# Patient Record
Sex: Male | Born: 1955 | Hispanic: Yes | Marital: Married | Smoking: Never smoker
Health system: Southern US, Community
[De-identification: ages and names within clinical notes are randomized; demographics above are authoritative.]

## PROBLEM LIST (undated history)

## (undated) DIAGNOSIS — K469 Unspecified abdominal hernia without obstruction or gangrene: Secondary | ICD-10-CM

## (undated) HISTORY — PX: HERNIA REPAIR: SHX51

---

## 2007-04-09 ENCOUNTER — Ambulatory Visit: Payer: Self-pay | Admitting: Family Medicine

## 2008-02-11 ENCOUNTER — Ambulatory Visit: Payer: Self-pay | Admitting: Family Medicine

## 2013-11-28 ENCOUNTER — Ambulatory Visit: Payer: Self-pay | Admitting: Medical

## 2016-06-18 ENCOUNTER — Encounter: Payer: Self-pay | Admitting: Emergency Medicine

## 2016-06-18 ENCOUNTER — Emergency Department: Payer: BLUE CROSS/BLUE SHIELD

## 2016-06-18 ENCOUNTER — Inpatient Hospital Stay
Admission: EM | Admit: 2016-06-18 | Discharge: 2016-06-20 | DRG: 872 | Disposition: A | Discharge status: Home / Self Care | Payer: BLUE CROSS/BLUE SHIELD | Attending: Internal Medicine | Admitting: Internal Medicine

## 2016-06-18 DIAGNOSIS — N3001 Acute cystitis with hematuria: Secondary | ICD-10-CM

## 2016-06-18 DIAGNOSIS — Z809 Family history of malignant neoplasm, unspecified: Secondary | ICD-10-CM | POA: Diagnosis not present

## 2016-06-18 DIAGNOSIS — R300 Dysuria: Secondary | ICD-10-CM | POA: Diagnosis not present

## 2016-06-18 DIAGNOSIS — A419 Sepsis, unspecified organism: Principal | ICD-10-CM | POA: Diagnosis present

## 2016-06-18 HISTORY — DX: Unspecified abdominal hernia without obstruction or gangrene: K46.9

## 2016-06-18 LAB — CBC WITH DIFFERENTIAL/PLATELET
BASOS ABS: 0.1 10*3/uL (ref 0–0.1)
Basophils Relative: 0 %
Eosinophils Absolute: 0 10*3/uL (ref 0–0.7)
Eosinophils Relative: 0 %
HEMATOCRIT: 47.9 % (ref 40.0–52.0)
HEMOGLOBIN: 16.2 g/dL (ref 13.0–18.0)
LYMPHS PCT: 3 %
Lymphs Abs: 0.6 10*3/uL — ABNORMAL LOW (ref 1.0–3.6)
MCH: 30.7 pg (ref 26.0–34.0)
MCHC: 33.7 g/dL (ref 32.0–36.0)
MCV: 91 fL (ref 80.0–100.0)
MONO ABS: 1.4 10*3/uL — AB (ref 0.2–1.0)
Monocytes Relative: 6 %
NEUTROS ABS: 20.6 10*3/uL — AB (ref 1.4–6.5)
NEUTROS PCT: 91 %
Platelets: 234 10*3/uL (ref 150–440)
RBC: 5.27 MIL/uL (ref 4.40–5.90)
RDW: 14.1 % (ref 11.5–14.5)
WBC: 22.6 10*3/uL — ABNORMAL HIGH (ref 3.8–10.6)

## 2016-06-18 LAB — URINALYSIS, ROUTINE W REFLEX MICROSCOPIC: SPECIFIC GRAVITY, URINE: 1.024 (ref 1.005–1.030)

## 2016-06-18 LAB — COMPREHENSIVE METABOLIC PANEL
ALBUMIN: 4.5 g/dL (ref 3.5–5.0)
ALT: 24 U/L (ref 17–63)
AST: 26 U/L (ref 15–41)
Alkaline Phosphatase: 63 U/L (ref 38–126)
Anion gap: 9 (ref 5–15)
BILIRUBIN TOTAL: 3 mg/dL — AB (ref 0.3–1.2)
BUN: 12 mg/dL (ref 6–20)
CO2: 23 mmol/L (ref 22–32)
Calcium: 9.2 mg/dL (ref 8.9–10.3)
Chloride: 101 mmol/L (ref 101–111)
Creatinine, Ser: 0.92 mg/dL (ref 0.61–1.24)
GFR calc Af Amer: >60 mL/min (ref >60)
GFR calc non Af Amer: >60 mL/min (ref >60)
GLUCOSE: 117 mg/dL — AB (ref 65–99)
POTASSIUM: 4.2 mmol/L (ref 3.5–5.1)
SODIUM: 133 mmol/L — AB (ref 135–145)
TOTAL PROTEIN: 8 g/dL (ref 6.5–8.1)

## 2016-06-18 LAB — URINALYSIS, COMPLETE (UACMP) WITH MICROSCOPIC
BILIRUBIN URINE: NEGATIVE
Bacteria, UA: NONE SEEN
GLUCOSE, UA: 150 mg/dL — AB
HGB URINE DIPSTICK: NEGATIVE
Ketones, ur: 5 mg/dL — AB
NITRITE: POSITIVE — AB
PROTEIN: 30 mg/dL — AB
Specific Gravity, Urine: 1.015 (ref 1.005–1.030)
pH: 7 (ref 5.0–8.0)

## 2016-06-18 LAB — LACTIC ACID, PLASMA: LACTIC ACID, VENOUS: 1.3 mmol/L (ref 0.5–1.9)

## 2016-06-18 LAB — GLUCOSE, CAPILLARY: GLUCOSE-CAPILLARY: 133 mg/dL — AB (ref 65–99)

## 2016-06-18 MED ORDER — ONDANSETRON HCL 4 MG/2ML IJ SOLN: 4.0000 mg | Freq: Four times a day (QID) | INTRAMUSCULAR | Status: DC | PRN

## 2016-06-18 MED ORDER — ONDANSETRON HCL 4 MG PO TABS: 4.0000 mg | ORAL_TABLET | Freq: Four times a day (QID) | ORAL | Status: DC | PRN

## 2016-06-18 MED ORDER — SODIUM CHLORIDE 0.9 % IV SOLN
INTRAVENOUS | Status: DC
  Administered 2016-06-18 – 2016-06-20 (×5): via INTRAVENOUS

## 2016-06-18 MED ORDER — DEXTROSE 5 % IV SOLN
1.0000 g | INTRAVENOUS | Status: DC
  Administered 2016-06-19: 1 g via INTRAVENOUS
  Filled 2016-06-18 (×2): qty 10

## 2016-06-18 MED ORDER — ACETAMINOPHEN 650 MG RE SUPP: 650.0000 mg | Freq: Four times a day (QID) | RECTAL | Status: DC | PRN

## 2016-06-18 MED ORDER — ACETAMINOPHEN 500 MG PO TABS
1000.0000 mg | ORAL_TABLET | ORAL | Status: AC
  Administered 2016-06-18: 1000 mg via ORAL
  Filled 2016-06-18: qty 2

## 2016-06-18 MED ORDER — PHENAZOPYRIDINE HCL 100 MG PO TABS
200.0000 mg | ORAL_TABLET | Freq: Three times a day (TID) | ORAL | Status: DC
Start: 2016-06-19 — End: 2016-06-20
  Administered 2016-06-19 – 2016-06-20 (×4): 200 mg via ORAL
  Filled 2016-06-18 (×4): qty 2

## 2016-06-18 MED ORDER — SODIUM CHLORIDE 0.9 % IV BOLUS (SEPSIS)
1000.0000 mL | Freq: Once | INTRAVENOUS | Status: AC
  Administered 2016-06-18: 1000 mL via INTRAVENOUS

## 2016-06-18 MED ORDER — DEXTROSE 5 % IV SOLN
2.0000 g | Freq: Once | INTRAVENOUS | Status: AC
  Administered 2016-06-18: 2 g via INTRAVENOUS
  Filled 2016-06-18: qty 2

## 2016-06-18 MED ORDER — ACETAMINOPHEN 325 MG PO TABS
650.0000 mg | ORAL_TABLET | Freq: Four times a day (QID) | ORAL | Status: DC | PRN
  Administered 2016-06-18 – 2016-06-20 (×3): 650 mg via ORAL
  Filled 2016-06-18 (×3): qty 2

## 2016-06-18 NOTE — ED Triage Notes (Signed)
Pt presents to ED from [facility]. Pt states on Friday began having urinary symptoms of lower back pain and burning with urination. Went to [facility] today and was found to have WBC of 23 at this time. Pt is alert and oriented at this time.

## 2016-06-18 NOTE — Progress Notes (Signed)
Pharmacy Antibiotic Note  Charles Rogers is a 61 y.o. male admitted on 06/18/2016 with UTI.  Pharmacy has been consulted for ceftriaxone dosing. Patient received ceftriaxone 2 g IV x 1  Plan: Begin ceftriaxone 1 g IV q 24 hours 4/9 @ 1800  Height:  (165.1 cm) Weight: 153 lb (69.4 kg) IBW/kg (Calculated) : 61.5  Temp (24hrs), Avg:101.6 F (38.7 C), Min:101.6 F (38.7 C), Max:101.6 F (38.7 C)  No results for input(s): WBC, CREATININE, LATICACIDVEN, VANCOTROUGH, VANCOPEAK, VANCORANDOM, GENTTROUGH, GENTPEAK, GENTRANDOM, TOBRATROUGH, TOBRAPEAK, TOBRARND, AMIKACINPEAK, AMIKACINTROU, AMIKACIN in the last 168 hours.  CrCl cannot be calculated (No order found.).    No Known Allergies  Antimicrobials this admission: Ceftriaxone 4/8 >>   Dose adjustments this admission:  Microbiology results: 4/8 BCx: sent 4/8 UCx: sent   Sputum:    MRSA PCR:   Thank you for allowing pharmacy to be a part of this patient's care.  Horris Latino, PharmD Pharmacy Resident 06/18/2016 3:23 PM

## 2016-06-18 NOTE — H&P (Signed)
[employer] [facility] - [place] at [facility]   PATIENT NAME: Charles Rogers    MR#:  [id number]  DATE OF BIRTH:  Sep 21, 1955  DATE OF ADMISSION:  06/18/2016  PRIMARY CARE PHYSICIAN: No PCP Per Patient   REQUESTING/REFERRING PHYSICIAN: Dr. Sharyn Creamer  CHIEF COMPLAINT:   Chief Complaint  Patient presents with  . Abnormal Labs  Fever, back pain, frequency of urination.  HISTORY OF PRESENT ILLNESS:  Charles Rogers  is a 61 y.o. male with No known past medical history who presents to the hospital due to back pain, fever, frequency of urination and also dysuria ongoing for the past 2-3 days. Patient says his fever was high as 104 today and therefore he was concerned and came to the ER for further evaluation. Patient was noted to have a leukocytosis, positive urinalysis consistent with UTI and met criteria for sepsis and therefore hospitalist services were contacted further treatment and evaluation. Patient denies any flank pain, nausea, vomiting, chest pain, shortness of breath. He denies any history of nephrolithiasis or any other acute symptoms presently.   PAST MEDICAL HISTORY:   Past Medical History:  Diagnosis Date  . Hernia of abdominal cavity     PAST SURGICAL HISTORY:   Past Surgical History:  Procedure Laterality Date  . HERNIA REPAIR      SOCIAL HISTORY:   Social History  Substance Use Topics  . Smoking status: Never Smoker  . Smokeless tobacco: Never Used  . Alcohol use No    FAMILY HISTORY:   Family History  Problem Relation Age of Onset  . Cancer Mother     DRUG ALLERGIES:  No Known Allergies  REVIEW OF SYSTEMS:   Review of Systems  Constitutional: Positive for fever. Negative for weight loss.  HENT: Negative for congestion, nosebleeds and tinnitus.   Eyes: Negative for blurred vision, double vision and redness.  Respiratory: Negative for cough, hemoptysis and shortness of breath.   Cardiovascular: Negative for chest pain, orthopnea, leg swelling  and PND.  Gastrointestinal: Negative for abdominal pain, diarrhea, melena, nausea and vomiting.  Genitourinary: Positive for dysuria, frequency and urgency. Negative for hematuria.  Musculoskeletal: Negative for falls and joint pain.  Neurological: Negative for dizziness, tingling, sensory change, focal weakness, seizures, weakness and headaches.  Endo/Heme/Allergies: Negative for polydipsia. Does not bruise/bleed easily.  Psychiatric/Behavioral: Negative for depression and memory loss. The patient is not nervous/anxious.     MEDICATIONS AT HOME:   Prior to Admission medications   Medication Sig Start Date End Date Taking? Authorizing Provider  acetaminophen (TYLENOL) 500 MG tablet Take 500 mg by mouth every 6 (six) hours as needed.   Yes Historical Provider, MD  Multiple Vitamin (MULTIVITAMIN) tablet Take 1 tablet by mouth daily.   Yes Historical Provider, MD      VITAL SIGNS:  Blood pressure 117/77, pulse (!) 103, temperature 98.8 F (37.1 C), temperature source Oral, resp. rate (!) 21, height 5\' 5"  (1.651 m), weight 69.4 kg (153 lb), SpO2 97 %.  PHYSICAL EXAMINATION:  Physical Exam  GENERAL:  61 y.o.-year-old patient lying in bed in no acute distress.  EYES: Pupils equal, round, reactive to light and accommodation. No scleral icterus. Extraocular muscles intact.  HEENT: Head atraumatic, normocephalic. Oropharynx and nasopharynx clear. No oropharyngeal erythema, moist oral mucosa  NECK:  Supple, no jugular venous distention. No thyroid enlargement, no tenderness.  LUNGS: Normal breath sounds bilaterally, no wheezing, rales, rhonchi. No use of accessory muscles of respiration.  CARDIOVASCULAR: S1, S2 RRR. No murmurs,  rubs, gallops, clicks.  ABDOMEN: Soft, nontender, nondistended. Bowel sounds present. No organomegaly or mass.  EXTREMITIES: No pedal edema, cyanosis, or clubbing. + 2 pedal & radial pulses b/l.   NEUROLOGIC: Cranial nerves II through XII are intact. No focal Motor or  sensory deficits appreciated b/l PSYCHIATRIC: The patient is alert and oriented x 3. SKIN: No obvious rash, lesion, or ulcer.   LABORATORY PANEL:   CBC  Recent Labs Lab 06/18/16 1503  WBC 22.6*  HGB 16.2  HCT 47.9  PLT 234   ------------------------------------------------------------------------------------------------------------------  Chemistries   Recent Labs Lab 06/18/16 1503  NA 133*  K 4.2  CL 101  CO2 23  GLUCOSE 117*  BUN 12  CREATININE 0.92  CALCIUM 9.2  AST 26  ALT 24  ALKPHOS 63  BILITOT 3.0*   ------------------------------------------------------------------------------------------------------------------  Cardiac Enzymes No results for input(s): TROPONINI in the last 168 hours. ------------------------------------------------------------------------------------------------------------------  RADIOLOGY:  Dg Chest Port 1 View  Result Date: 06/18/2016 CLINICAL DATA:  Fever for several days.  Leukocytosis. EXAM: PORTABLE CHEST 1 VIEW COMPARISON:  None. FINDINGS: The heart size and mediastinal contours are within normal limits. Both lungs are clear. The visualized skeletal structures are unremarkable. IMPRESSION: No active disease. Electronically Signed   By: Earle Gell M.D.   On: 06/18/2016 15:27   Ct Renal Stone Study  Result Date: 06/18/2016 CLINICAL DATA:  Left flank pain, dysuria, and fever beginning yesterday. EXAM: CT ABDOMEN AND PELVIS WITHOUT CONTRAST TECHNIQUE: Multidetector CT imaging of the abdomen and pelvis was performed following the standard protocol without IV contrast. COMPARISON:  None. FINDINGS: Lower chest: No acute findings. Hepatobiliary: No masses visualized on this unenhanced exam. Gallbladder is unremarkable. Pancreas: No mass or inflammatory process visualized on this unenhanced exam. Spleen:  Within normal limits in size. Adrenals/Urinary tract: No evidence of urolithiasis or hydronephrosis. Unremarkable unopacified urinary  bladder. Stomach/Bowel: Moderate size hiatal hernia. No evidence of obstruction, inflammatory process, or abnormal fluid collections. Vascular/Lymphatic: No pathologically enlarged lymph nodes identified. No evidence of abdominal aortic aneurysm. Aortic atherosclerosis. Reproductive:  Mildly enlarged prostate. Other:  None. Musculoskeletal:  No suspicious bone lesions identified. IMPRESSION: No evidence of urolithiasis, hydronephrosis, or other acute findings. Moderate hiatal hernia. Mildly enlarged prostate. Aortic atherosclerosis. Electronically Signed   By: Earle Gell M.D.   On: 06/18/2016 15:49     IMPRESSION AND PLAN:   61 year old male with no significant past medical history presents to the hospital due to frequency of urination, dysuria and fever and noted to have urinary tract infection.  1. Sepsis-patient meets sepsis criteria given his fever, leukocytosis and a positive urinalysis. -Secondary to UTI. I will treat patient with IV ceftriaxone, IV fluids, follow blood, urine cultures.  2. Urinary tract infection-source of patient's fever. -Continue IV ceftriaxone, follow urine, blood cultures. -CT renal stone study negative for any acute nephrolithiasis or hydronephrosis.  3. Leukocytosis-secondary to #1 and 2 - follow with IV antibiotic therapy.    All the records are reviewed and case discussed with ED provider. Management plans discussed with the patient, family and they are in agreement.  CODE STATUS: Full  TOTAL TIME TAKING CARE OF THIS PATIENT: 40 minutes.    Henreitta Leber M.D on 06/18/2016 at 5:55 PM  Between 7am to 6pm - Pager - [phone]  After 6pm go to [email or web address] - password [email or web address] [facility]  [facility]  Office  [phone]  CC: Primary care physician; No PCP Per Patient

## 2016-06-18 NOTE — ED Notes (Signed)
Pt transport to 160 

## 2016-06-18 NOTE — ED Provider Notes (Signed)
[facility] Emergency Department Provider Note   ____________________________________________   First MD Initiated Contact with Patient 06/18/16 1501     (approximate)  I have reviewed the triage vital signs and the nursing notes.   HISTORY  Chief Complaint Abnormal Labs   HPI Charles Rogers is a 61 y.o. male here for evaluation of fevers and chills  Sent here from urgent care, reports that on their concern about a blood infection. For the last 2-3 days been having pain with urination, developing fevers, chills and muscle aches. No neck stiffness or pain. Had some nausea but no vomiting. Before the symptoms started about achy pain in his left lower back that went away. Moderate, burning pain with urination. Denies recent hospitalizations or antibiotics. No history of kidney stones  Reports he feels hungry and would like something to eat.  Past Medical History:  Diagnosis Date  . Hernia of abdominal cavity     There are no active problems to display for this patient.   Past Surgical History:  Procedure Laterality Date  . HERNIA REPAIR      Prior to Admission medications   Not on File    Allergies Patient has no known allergies.  No family history on file.  Social History Social History  Substance Use Topics  . Smoking status: Never Smoker  . Smokeless tobacco: Never Used  . Alcohol use No    Review of Systems Constitutional: Fevers and chills Eyes: No visual changes. ENT: No sore throat. Cardiovascular: Denies chest pain. Respiratory: Denies shortness of breath. Gastrointestinal: No abdominal pain.  No nausea, no vomiting.  No diarrhea.  No constipation. Genitourinary: See history of present illness, burning and discomfort with urination Musculoskeletal: Negative for back pain. Skin: Negative for rash. Neurological: Negative for headaches, focal weakness or numbness.  10-point ROS otherwise  negative.  ____________________________________________   PHYSICAL EXAM:  VITAL SIGNS: ED Triage Vitals  Enc Vitals Group     BP 06/18/16 1454 130/83     Pulse Rate 06/18/16 1454 (!) 114     Resp 06/18/16 1454 (!) 25     Temp 06/18/16 1454 (!) 101.6 F (38.7 C)     Temp Source 06/18/16 1454 Oral     SpO2 06/18/16 1454 97 %     Weight 06/18/16 1448 153 lb (69.4 kg)     Height 06/18/16 1448  (1.651 m)     Head Circumference --      Peak Flow --      Pain Score --      Pain Loc --      Pain Edu? --      Excl. in GC? --     Constitutional: Alert and oriented. Well appearing and in no acute distress. Eyes: Conjunctivae are normal. PERRL. EOMI. Head: Atraumatic. Nose: No congestion/rhinnorhea. Mouth/Throat: Mucous membranes are moist.  Oropharynx non-erythematous. Neck: No stridor.   Cardiovascular: Tachycardic rate, regular rhythm. Grossly normal heart sounds.  Good peripheral circulation. Respiratory: Normal respiratory effort.  No retractions. Lungs CTAB. Gastrointestinal: Soft and nontender. No distention. No abdominal bruits. Minimal left CVA tenderness Musculoskeletal: No lower extremity tenderness nor edema.   Neurologic:  Normal speech and language. No gross focal neurologic deficits are appreciated.  Skin:  Skin is warm, dry and intact. No rash noted. Psychiatric: Mood and affect are normal. Speech and behavior are normal.  ____________________________________________   LABS (all labs ordered are listed, but only abnormal results are displayed)  Labs Reviewed  COMPREHENSIVE METABOLIC PANEL - Abnormal; Notable for the following:       Result Value   Sodium 133 (*)    Glucose, Bld 117 (*)    Total Bilirubin 3.0 (*)    All other components within normal limits  CBC WITH DIFFERENTIAL/PLATELET - Abnormal; Notable for the following:    WBC 22.6 (*)    Neutro Abs 20.6 (*)    Lymphs Abs 0.6 (*)    Monocytes Absolute 1.4 (*)    All other components within  normal limits  URINALYSIS, ROUTINE W REFLEX MICROSCOPIC - Abnormal; Notable for the following:    Color, Urine ORANGE (*)    Glucose, UA   (*)    Value: TEST NOT REPORTED DUE TO COLOR INTERFERENCE OF URINE PIGMENT   Hgb urine dipstick   (*)    Value: TEST NOT REPORTED DUE TO COLOR INTERFERENCE OF URINE PIGMENT   Bilirubin Urine   (*)    Value: TEST NOT REPORTED DUE TO COLOR INTERFERENCE OF URINE PIGMENT   Ketones, ur   (*)    Value: TEST NOT REPORTED DUE TO COLOR INTERFERENCE OF URINE PIGMENT   Protein, ur   (*)    Value: TEST NOT REPORTED DUE TO COLOR INTERFERENCE OF URINE PIGMENT   Nitrite   (*)    Value: TEST NOT REPORTED DUE TO COLOR INTERFERENCE OF URINE PIGMENT   Leukocytes, UA   (*)    Value: TEST NOT REPORTED DUE TO COLOR INTERFERENCE OF URINE PIGMENT   Bacteria, UA RARE (*)    Squamous Epithelial / LPF 0-5 (*)    All other components within normal limits  GLUCOSE, CAPILLARY - Abnormal; Notable for the following:    Glucose-Capillary 133 (*)    All other components within normal limits  CULTURE, BLOOD (ROUTINE X 2)  CULTURE, BLOOD (ROUTINE X 2)  URINE CULTURE  LACTIC ACID, PLASMA  LACTIC ACID, PLASMA   ____________________________________________  EKG   ____________________________________________  RADIOLOGY  Dg Chest Port 1 View  Result Date: 06/18/2016 CLINICAL DATA:  Fever for several days.  Leukocytosis. EXAM: PORTABLE CHEST 1 VIEW COMPARISON:  None. FINDINGS: The heart size and mediastinal contours are within normal limits. Both lungs are clear. The visualized skeletal structures are unremarkable. IMPRESSION: No active disease. Electronically Signed   By: Myles Rosenthal M.D.   On: 06/18/2016 15:27   Ct Renal Stone Study  Result Date: 06/18/2016 CLINICAL DATA:  Left flank pain, dysuria, and fever beginning yesterday. EXAM: CT ABDOMEN AND PELVIS WITHOUT CONTRAST TECHNIQUE: Multidetector CT imaging of the abdomen and pelvis was performed following the standard  protocol without IV contrast. COMPARISON:  None. FINDINGS: Lower chest: No acute findings. Hepatobiliary: No masses visualized on this unenhanced exam. Gallbladder is unremarkable. Pancreas: No mass or inflammatory process visualized on this unenhanced exam. Spleen:  Within normal limits in size. Adrenals/Urinary tract: No evidence of urolithiasis or hydronephrosis. Unremarkable unopacified urinary bladder. Stomach/Bowel: Moderate size hiatal hernia. No evidence of obstruction, inflammatory process, or abnormal fluid collections. Vascular/Lymphatic: No pathologically enlarged lymph nodes identified. No evidence of abdominal aortic aneurysm. Aortic atherosclerosis. Reproductive:  Mildly enlarged prostate. Other:  None. Musculoskeletal:  No suspicious bone lesions identified. IMPRESSION: No evidence of urolithiasis, hydronephrosis, or other acute findings. Moderate hiatal hernia. Mildly enlarged prostate. Aortic atherosclerosis. Electronically Signed   By: Myles Rosenthal M.D.   On: 06/18/2016 15:49    ____________________________________________   PROCEDURES  Procedure(s) performed: None  Procedures  Critical Care performed: No  ____________________________________________  INITIAL IMPRESSION / ASSESSMENT AND PLAN / ED COURSE  Pertinent labs & imaging results that were available during my care of the patient were reviewed by me and considered in my medical decision making (see chart for details).  Patient presents with notable fever, tachycardia, and dysuria for the last 2-3 days. He did have a left flank pain and discomfort with that earlier, which are concerning for possible kidney stone, pyelonephritis, or other acute intra-abdominal process. At this point brought differential, likely urinary though, but we will cast a wide net for intra-abdominal chest and other etiologies.  Initiate antipyretic, fluids, ceftriaxone     Urinalysis difficult to interpret, but sent for culture. Urine at  urgent care does note leukocytes and nitrites. Patient reports he has taken Azo tablet was causing interference  ----------------------------------------- 4:35 PM on 06/18/2016 -----------------------------------------  Patient reports improvement, heart rate improving. We will admit for ongoing care and concerns for possible bacteremia ____________________________________________   FINAL CLINICAL IMPRESSION(S) / ED DIAGNOSES  Final diagnoses:  Acute cystitis with hematuria  Sepsis, due to unspecified organism [facility])      NEW MEDICATIONS STARTED DURING THIS VISIT:  New Prescriptions   No medications on file     Note:  This document was prepared using Dragon voice recognition software and may include unintentional dictation errors.     Sharyn Creamer, MD 06/18/16 [phone]

## 2016-06-19 LAB — CBC
HCT: 40.9 % (ref 40.0–52.0)
Hemoglobin: 13.7 g/dL (ref 13.0–18.0)
MCH: 30.8 pg (ref 26.0–34.0)
MCHC: 33.6 g/dL (ref 32.0–36.0)
MCV: 91.8 fL (ref 80.0–100.0)
PLATELETS: 181 10*3/uL (ref 150–440)
RBC: 4.45 MIL/uL (ref 4.40–5.90)
RDW: 14.1 % (ref 11.5–14.5)
WBC: 21.8 10*3/uL — AB (ref 3.8–10.6)

## 2016-06-19 LAB — BASIC METABOLIC PANEL
Anion gap: 4 — ABNORMAL LOW (ref 5–15)
BUN: 10 mg/dL (ref 6–20)
CHLORIDE: 109 mmol/L (ref 101–111)
CO2: 21 mmol/L — ABNORMAL LOW (ref 22–32)
CREATININE: 0.61 mg/dL (ref 0.61–1.24)
Calcium: 8.2 mg/dL — ABNORMAL LOW (ref 8.9–10.3)
Glucose, Bld: 130 mg/dL — ABNORMAL HIGH (ref 65–99)
Potassium: 4 mmol/L (ref 3.5–5.1)
SODIUM: 134 mmol/L — AB (ref 135–145)

## 2016-06-19 NOTE — Progress Notes (Addendum)
[employer] - [place] at [facility]   PATIENT NAME: Charles Rogers    MR#:  [id number]  DATE OF BIRTH:  01-15-1956  SUBJECTIVE:  CHIEF COMPLAINT:   Chief Complaint  Patient presents with  . Abnormal Labs   Lower abdominal pain and dysuria. REVIEW OF SYSTEMS:  Review of Systems  Constitutional: Negative for chills, fever and malaise/fatigue.  HENT: Negative for congestion.   Eyes: Negative for blurred vision and double vision.  Respiratory: Negative for cough, shortness of breath, wheezing and stridor.   Cardiovascular: Negative for chest pain and leg swelling.  Gastrointestinal: Negative for abdominal pain, blood in stool, constipation, diarrhea, heartburn, melena, nausea and vomiting.  Genitourinary: Positive for dysuria, frequency and urgency. Negative for flank pain and hematuria.  Musculoskeletal: Negative for back pain.  Skin: Negative for itching and rash.  Neurological: Negative for dizziness, focal weakness, loss of consciousness, weakness and headaches.  Psychiatric/Behavioral: Negative for depression. The patient is not nervous/anxious.     DRUG ALLERGIES:  No Known Allergies VITALS:  Blood pressure (!) 154/91, pulse 99, temperature 99.8 F (37.7 C), temperature source Oral, resp. rate 18, height  (1.651 m), weight 153 lb (69.4 kg), SpO2 99 %. PHYSICAL EXAMINATION:  Physical Exam  Constitutional: He is oriented to person, place, and time and well-developed, well-nourished, and in no distress.  HENT:  Mouth/Throat: Oropharynx is clear and moist.  Eyes: Conjunctivae and EOM are normal. No scleral icterus.  Neck: Normal range of motion. Neck supple. No JVD present. No tracheal deviation present.  Cardiovascular: Normal rate, regular rhythm and normal heart sounds.  Exam reveals no gallop.   No murmur heard. Pulmonary/Chest: Effort normal and breath sounds normal. No respiratory distress. He has no wheezes. He has no rales.  Abdominal: Soft.  Bowel sounds are normal. He exhibits no distension. There is no tenderness. There is no rebound.  Musculoskeletal: Normal range of motion. He exhibits no edema or tenderness.  Neurological: He is alert and oriented to person, place, and time. No cranial nerve deficit.  Skin: No rash noted. No erythema.  Psychiatric: Affect and judgment normal.   LABORATORY PANEL:  Male CBC  Recent Labs Lab 06/19/16 0554  WBC 21.8*  HGB 13.7  HCT 40.9  PLT 181   ------------------------------------------------------------------------------------------------------------------ Chemistries   Recent Labs Lab 06/18/16 1503 06/19/16 0554  NA 133* 134*  K 4.2 4.0  CL 101 109  CO2 23 21*  GLUCOSE 117* 130*  BUN 12 10  CREATININE 0.92 0.61  CALCIUM 9.2 8.2*  AST 26  --   ALT 24  --   ALKPHOS 63  --   BILITOT 3.0*  --    RADIOLOGY:  No results found. ASSESSMENT AND PLAN:   61 year old male with no significant past medical history presents to the hospital due to frequency of urination, dysuria and fever and noted to have urinary tract infection.  1. Sepsis-patient meets sepsis criteria given his fever, leukocytosis and a positive urinalysis. -Secondary to UTI. Continue IV ceftriaxone, IV fluids, follow blood and CBC.  2. Urinary tract infection-source of patient's fever. -Continue IV ceftriaxone, follow up blood cultures and CBC. urine cultures: E Coli  -CT renal stone study negative for any acute nephrolithiasis or hydronephrosis.  3. Leukocytosis-secondary to #1 and 2. WBC is still high at 21,800. - follow with IV antibiotic therapy. Follow-up CBC.  All the records are reviewed and case discussed with Care Management/Social Worker. Management plans discussed with the patient, his white count  and they are in agreement.  CODE STATUS: Full Code  TOTAL TIME TAKING CARE OF THIS PATIENT: 30 minutes.   More than 50% of the time was spent in counseling/coordination of care:  YES  POSSIBLE D/C IN 2 DAYS, DEPENDING ON CLINICAL CONDITION.   Shaune Pollack M.D on 06/19/2016 at 4:08 PM  Between 7am to 6pm - Pager - [phone]  After 6pm go to [email or web address] - Scientist, research (life sciences) [facility] [facility]  Office  [phone]  CC: Primary care physician; No PCP Per Patient  Note: This dictation was prepared with Dragon dictation along with smaller phrase technology. Any transcriptional errors that result from this process are unintentional.

## 2016-06-20 LAB — CBC
HCT: 41.5 % (ref 40.0–52.0)
Hemoglobin: 13.7 g/dL (ref 13.0–18.0)
MCH: 30.4 pg (ref 26.0–34.0)
MCHC: 32.9 g/dL (ref 32.0–36.0)
MCV: 92.3 fL (ref 80.0–100.0)
PLATELETS: 205 10*3/uL (ref 150–440)
RBC: 4.5 MIL/uL (ref 4.40–5.90)
RDW: 13.7 % (ref 11.5–14.5)
WBC: 13.6 10*3/uL — AB (ref 3.8–10.6)

## 2016-06-20 LAB — URINE CULTURE
Culture: 80000 — AB
Special Requests: NORMAL

## 2016-06-20 MED ORDER — CIPROFLOXACIN HCL 500 MG PO TABS: 500.0000 mg | ORAL_TABLET | Freq: Two times a day (BID) | ORAL | 0 refills | Status: AC

## 2016-06-20 MED ORDER — CIPROFLOXACIN HCL 500 MG PO TABS
500.0000 mg | ORAL_TABLET | Freq: Two times a day (BID) | ORAL | Status: DC
  Administered 2016-06-20: 500 mg via ORAL
  Filled 2016-06-20: qty 1

## 2016-06-20 NOTE — Discharge Instructions (Signed)
Heart healthy diet

## 2016-06-20 NOTE — Discharge Summary (Signed)
[facility] - [place] [facility]   PATIENT NAME: Charles Rogers    MR#:  [id number]  DATE OF BIRTH:  Jan 28, 1956  DATE OF ADMISSION:  06/18/2016   ADMITTING PHYSICIAN: Houston Siren, MD  DATE OF DISCHARGE: 06/20/2016 11:35 AM  PRIMARY CARE PHYSICIAN: No PCP Per Patient   ADMISSION DIAGNOSIS:  Acute cystitis with hematuria [N30.01] Sepsis, due to unspecified organism (HCC) [A41.9] DISCHARGE DIAGNOSIS:  Active Problems:   Sepsis (HCC)  SECONDARY DIAGNOSIS:   Past Medical History:  Diagnosis Date  . Hernia of abdominal cavity    HOSPITAL COURSE:   61 year old male with no significant past medical history presents to the hospital due to frequency of urination, dysuria and fever and noted to have urinary tract infection.  1. Sepsis-patient meets sepsis criteria given his fever, leukocytosis and a positive urinalysis. -Secondary to UTI. -CT renal stone study negative for any acute nephrolithiasis or hydronephrosis. He has been treated with  IV ceftriaxone, IV fluids, blood culture is negative so far. Urine culture showed Escherichia coli, sensitive to Rocephin and Cipro. 2. Leukocytosis. Improving with antibiotics treatment. Follow-up CBC as outpatient.  DISCHARGE CONDITIONS:  Stable, discharge to home today. CONSULTS OBTAINED:   DRUG ALLERGIES:  No Known Allergies DISCHARGE MEDICATIONS:   Allergies as of 06/20/2016   No Known Allergies     Medication List    TAKE these medications   acetaminophen 500 MG tablet Commonly known as:  TYLENOL Take 500 mg by mouth every 6 (six) hours as needed.   ciprofloxacin 500 MG tablet Commonly known as:  CIPRO Take 1 tablet (500 mg total) by mouth 2 (two) times daily.   multivitamin tablet Take 1 tablet by mouth daily.        DISCHARGE INSTRUCTIONS:  See AVS.  If you experience worsening of your admission symptoms, develop shortness of breath, life threatening emergency, suicidal or homicidal  thoughts you must seek medical attention immediately by calling 911 or calling your MD immediately  if symptoms less severe.  You Must read complete instructions/literature along with all the possible adverse reactions/side effects for all the Medicines you take and that have been prescribed to you. Take any new Medicines after you have completely understood and accpet all the possible adverse reactions/side effects.   Please note  You were cared for by a hospitalist during your hospital stay. If you have any questions about your discharge medications or the care you received while you were in the hospital after you are discharged, you can call the unit and asked to speak with the hospitalist on call if the hospitalist that took care of you is not available. Once you are discharged, your primary care physician will handle any further medical issues. Please note that NO REFILLS for any discharge medications will be authorized once you are discharged, as it is imperative that you return to your primary care physician (or establish a relationship with a primary care physician if you do not have one) for your aftercare needs so that they can reassess your need for medications and monitor your lab values.    On the day of Discharge:  VITAL SIGNS:  Blood pressure 131/87, pulse 75, temperature 97.6 F (36.4 C), resp. rate 18, height  (1.651 m), weight 153 lb (69.4 kg), SpO2 95 %. PHYSICAL EXAMINATION:  GENERAL:  61 y.o.-year-old patient lying in the bed with no acute distress.  EYES: Pupils equal, round, reactive to light and accommodation. No scleral icterus. Extraocular  muscles intact.  HEENT: Head atraumatic, normocephalic. Oropharynx and nasopharynx clear.  NECK:  Supple, no jugular venous distention. No thyroid enlargement, no tenderness.  LUNGS: Normal breath sounds bilaterally, no wheezing, rales,rhonchi or crepitation. No use of accessory muscles of respiration.  CARDIOVASCULAR: S1, S2  normal. No murmurs, rubs, or gallops.  ABDOMEN: Soft, non-tender, non-distended. Bowel sounds present. No organomegaly or mass.  EXTREMITIES: No pedal edema, cyanosis, or clubbing.  NEUROLOGIC: Cranial nerves II through XII are intact. Muscle strength 5/5 in all extremities. Sensation intact. Gait not checked.  PSYCHIATRIC: The patient is alert and oriented x 3.  SKIN: No obvious rash, lesion, or ulcer.  DATA REVIEW:   CBC  Recent Labs Lab 06/20/16 0512  WBC 13.6*  HGB 13.7  HCT 41.5  PLT 205    Chemistries   Recent Labs Lab 06/18/16 1503 06/19/16 0554  NA 133* 134*  K 4.2 4.0  CL 101 109  CO2 23 21*  GLUCOSE 117* 130*  BUN 12 10  CREATININE 0.92 0.61  CALCIUM 9.2 8.2*  AST 26  --   ALT 24  --   ALKPHOS 63  --   BILITOT 3.0*  --      Microbiology Results  Results for orders placed or performed during the hospital encounter of 06/18/16  Blood Culture (routine x 2)     Status: None (Preliminary result)   Collection Time: 06/18/16  3:04 PM  Result Value Ref Range Status   Specimen Description BLOOD RIGHT HAND  Final   Special Requests   Final    BOTTLES DRAWN AEROBIC AND ANAEROBIC Blood Culture adequate volume   Culture NO GROWTH 2 DAYS  Final   Report Status PENDING  Incomplete  Blood Culture (routine x 2)     Status: None (Preliminary result)   Collection Time: 06/18/16  3:04 PM  Result Value Ref Range Status   Specimen Description BLOOD RIGHT ARM  Final   Special Requests   Final    BOTTLES DRAWN AEROBIC AND ANAEROBIC Blood Culture adequate volume   Culture NO GROWTH 2 DAYS  Final   Report Status PENDING  Incomplete  Urine culture     Status: Abnormal   Collection Time: 06/18/16  3:04 PM  Result Value Ref Range Status   Specimen Description URINE, RANDOM  Final   Special Requests Normal  Final   Culture 80,000 COLONIES/mL ESCHERICHIA COLI (A)  Final   Report Status 06/20/2016 FINAL  Final   Organism ID, Bacteria ESCHERICHIA COLI (A)  Final       Susceptibility   Escherichia coli - MIC*    AMPICILLIN >=32 RESISTANT Resistant     CEFAZOLIN <=4 SENSITIVE Sensitive     CEFTRIAXONE <=1 SENSITIVE Sensitive     CIPROFLOXACIN <=0.25 SENSITIVE Sensitive     GENTAMICIN >=16 RESISTANT Resistant     IMIPENEM <=0.25 SENSITIVE Sensitive     NITROFURANTOIN <=16 SENSITIVE Sensitive     TRIMETH/SULFA >=320 RESISTANT Resistant     AMPICILLIN/SULBACTAM 16 INTERMEDIATE Intermediate     PIP/TAZO <=4 SENSITIVE Sensitive     Extended ESBL NEGATIVE Sensitive     * 80,000 COLONIES/mL ESCHERICHIA COLI    RADIOLOGY:  No results found.   Management plans discussed with the patient, family and they are in agreement.  CODE STATUS: Full Code   TOTAL TIME TAKING CARE OF THIS PATIENT: 23 minutes.    Shaune Pollack M.D on 06/20/2016 at 1:42 PM  Between 7am to 6pm - Pager - [phone]  After 6pm go to [email or web address] - Scientist, research (life sciences) [facility]  Office  [phone]  CC: Primary care physician; No PCP Per Patient   Note: This dictation was prepared with Dragon dictation along with smaller phrase technology. Any transcriptional errors that result from this process are unintentional.

## 2016-06-20 NOTE — Progress Notes (Signed)
Pt discharged to home without incident per MD order accompanied by family member via wheelchair. Pt pain controlled on discharge. No change in pt from AM assessment upon discharge. Prior to d/c all discharge teachings done both written and verbal. Pt and family questions answered and they agree to comply.

## 2016-06-23 LAB — CULTURE, BLOOD (ROUTINE X 2)
CULTURE: NO GROWTH
CULTURE: NO GROWTH
SPECIAL REQUESTS: ADEQUATE
SPECIAL REQUESTS: ADEQUATE

## 2018-03-18 IMAGING — CT CT RENAL STONE PROTOCOL
2 of 4 series · 16 of 46 positions shown, 18 images · non-contrast
Comparison: None.

CLINICAL DATA: Left flank pain, dysuria, and fever beginning
yesterday.

EXAM:
CT ABDOMEN AND PELVIS WITHOUT CONTRAST
TECHNIQUE: Multidetector CT imaging of the abdomen and pelvis was performed
following the standard protocol without IV contrast.

[Series 2: stone full standard · axial · 0.78mm/px · z∈[-376,+49]mm · 13 of 95 slices shown, 15 images]
[im 5/95  soft-tissue]
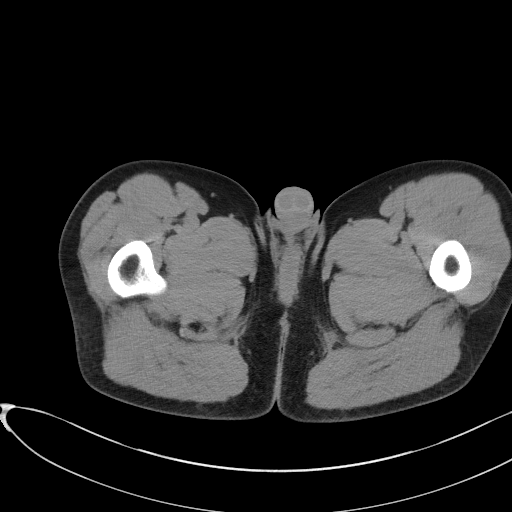
[im 5/95  bone]
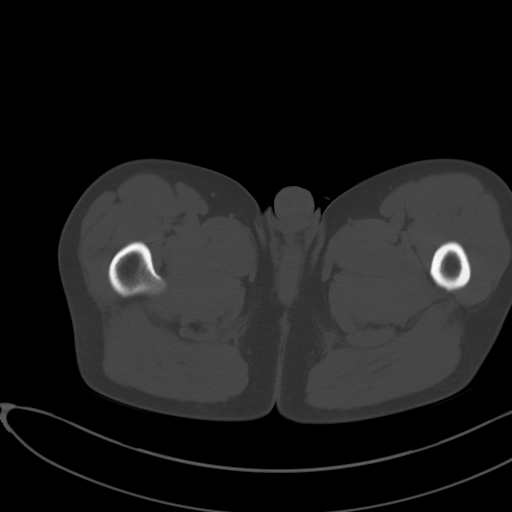
[im 13/95  soft-tissue]
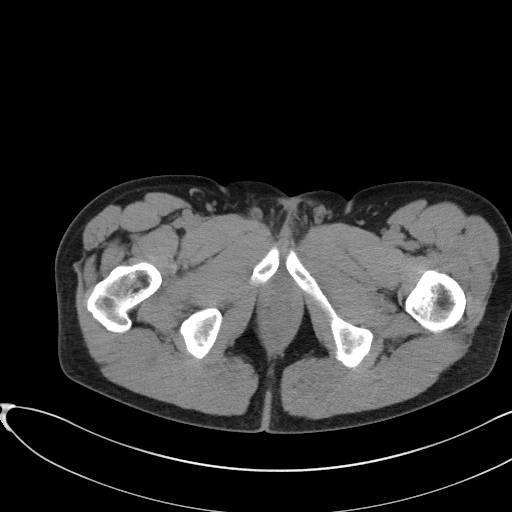
[im 21/95  soft-tissue]
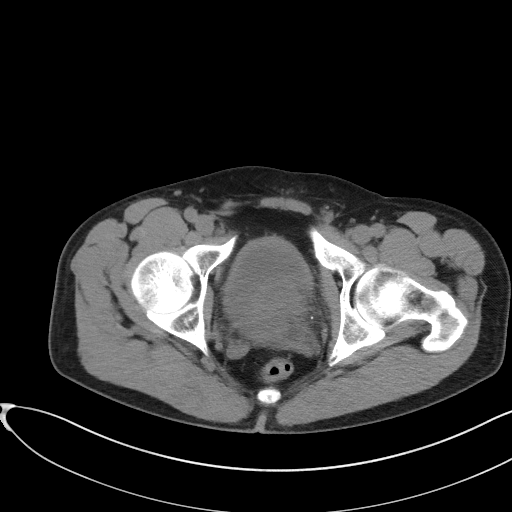
[im 25/95  soft-tissue]
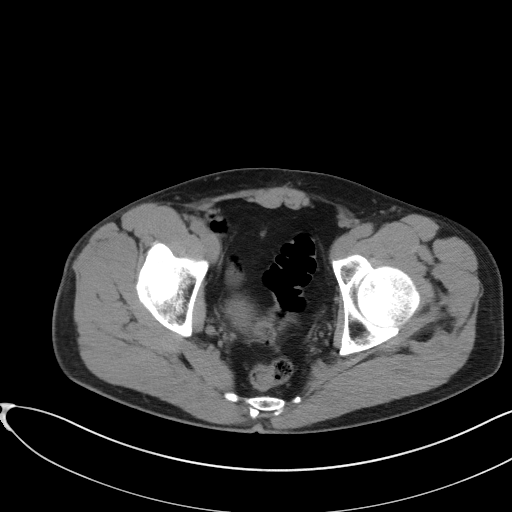
[im 33/95  soft-tissue]
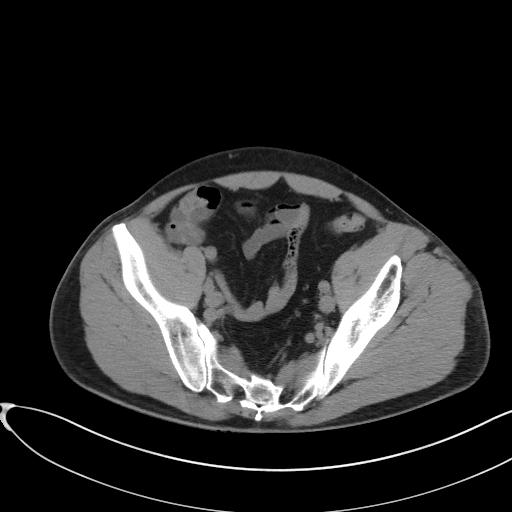
[im 41/95  soft-tissue]
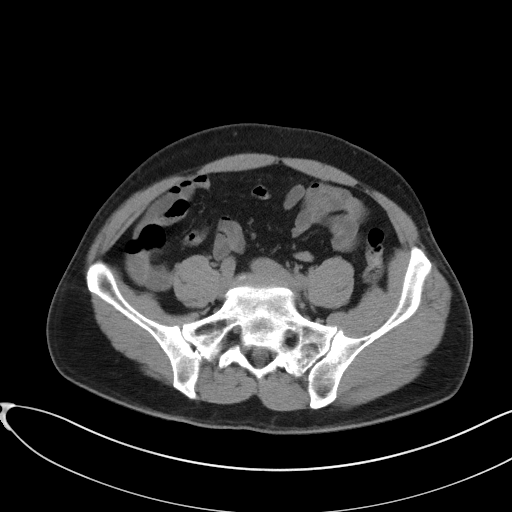
[im 50/95  soft-tissue]
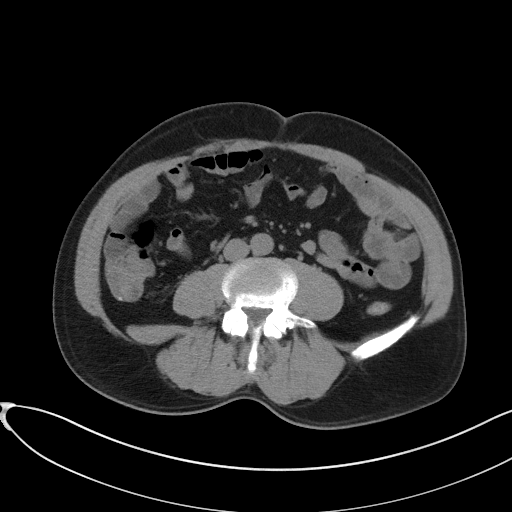
[im 54/95  soft-tissue]
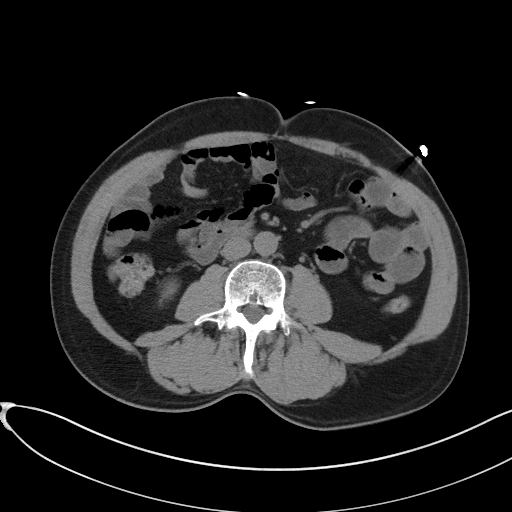
[im 62/95  soft-tissue]
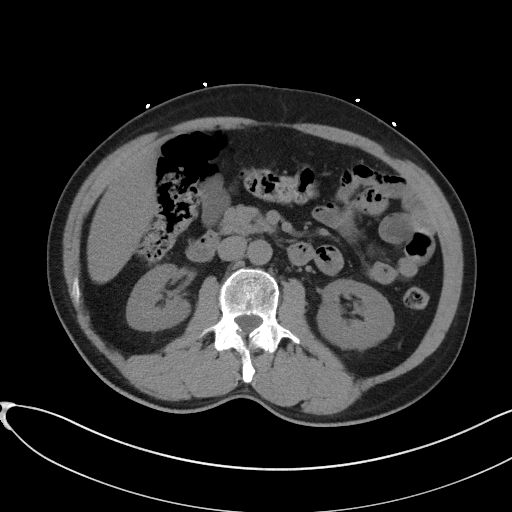
[im 62/95  bone]
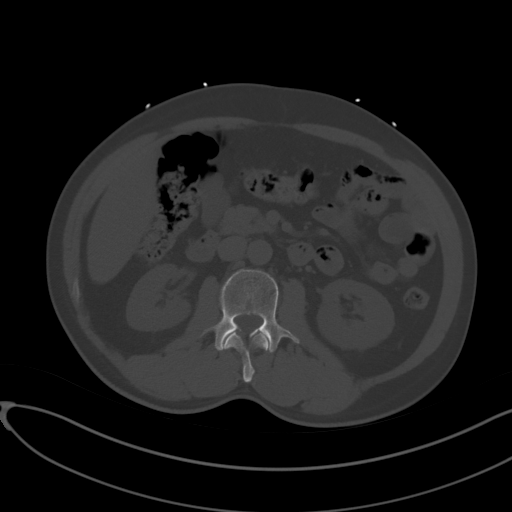
[im 70/95  soft-tissue]
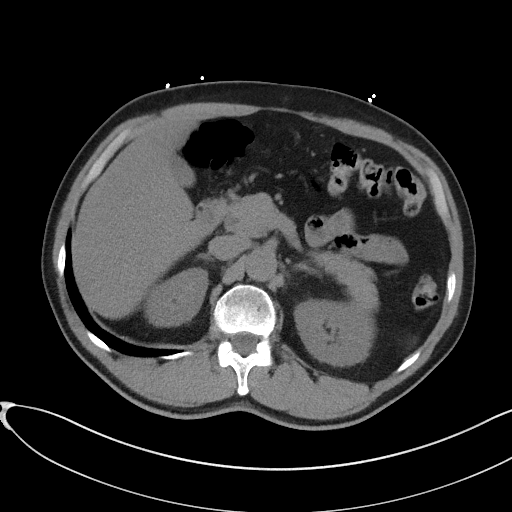
[im 74/95  soft-tissue]
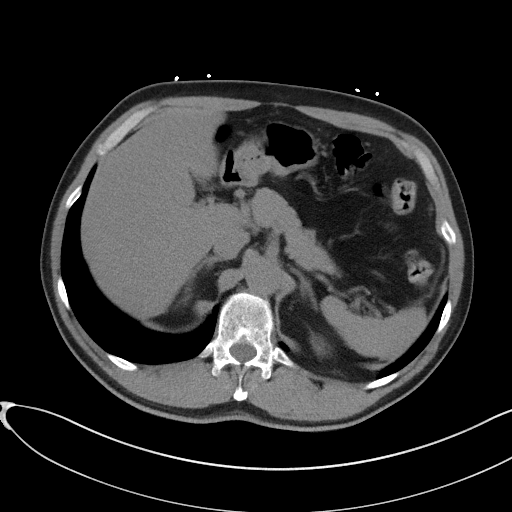
[im 82/95  soft-tissue]
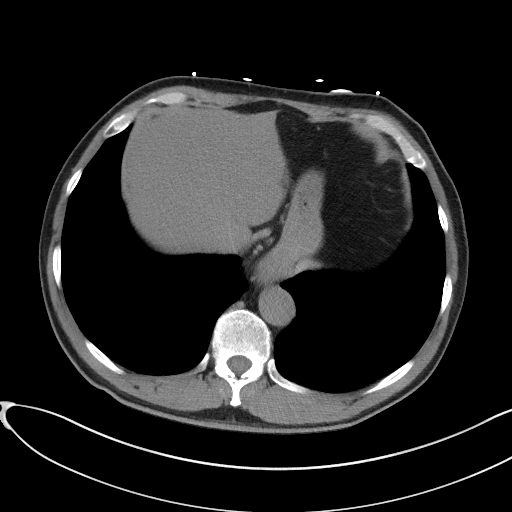
[im 90/95  soft-tissue]
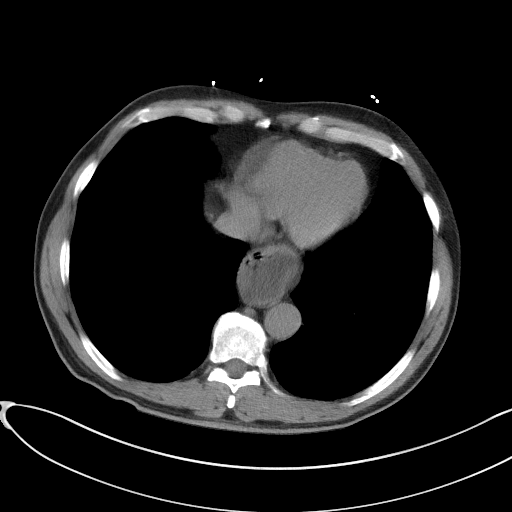

[Series 5: coronal · coronal · 0.73mm/px · 3 of 134 slices shown]
[im 45/134  soft-tissue]
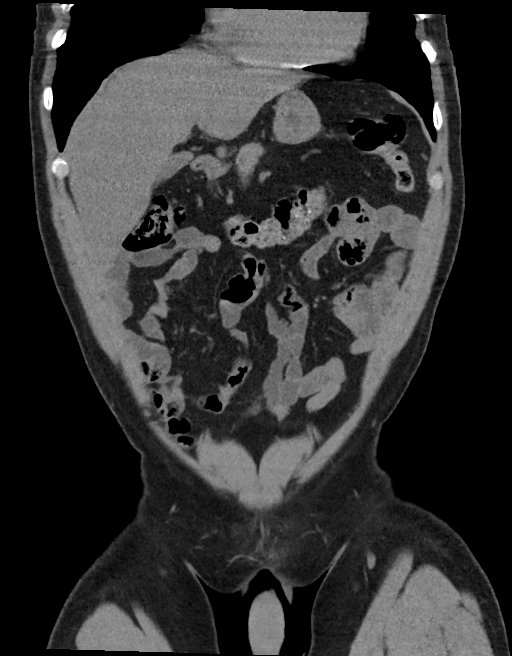
[im 60/134  soft-tissue]
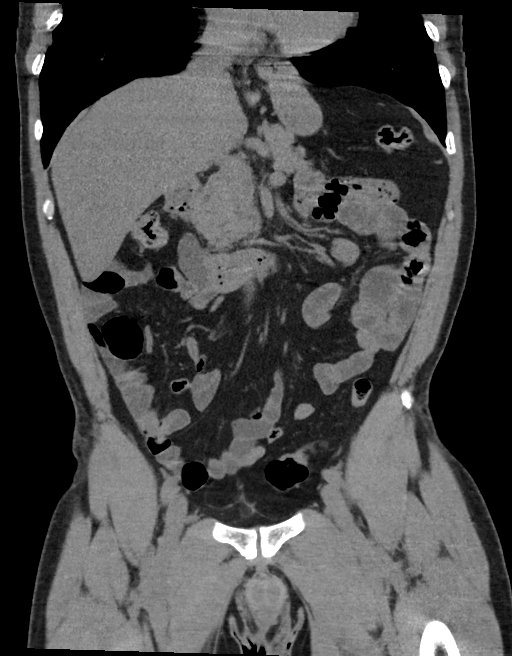
[im 74/134  soft-tissue]
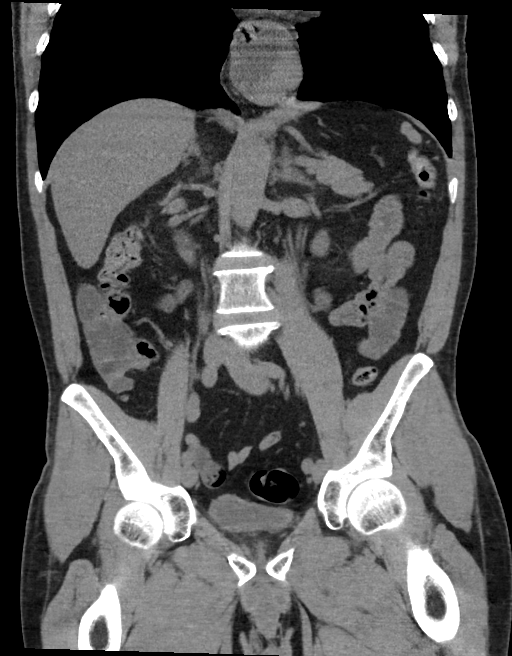

[16 of 46 positions shown; findings below may reference images not displayed]

FINDINGS: Lower chest: No acute findings.

Hepatobiliary: No masses visualized on this unenhanced exam.
Gallbladder is unremarkable.

Pancreas: No mass or inflammatory process visualized on this
unenhanced exam.

Spleen:  Within normal limits in size.

Adrenals/Urinary tract: No evidence of urolithiasis or
hydronephrosis. Unremarkable unopacified urinary bladder.

Stomach/Bowel: Moderate size hiatal hernia. No evidence of
obstruction, inflammatory process, or abnormal fluid collections.

Vascular/Lymphatic: No pathologically enlarged lymph nodes
identified. No evidence of abdominal aortic aneurysm. Aortic
atherosclerosis.

Reproductive:  Mildly enlarged prostate.

Other:  None.

Musculoskeletal:  No suspicious bone lesions identified.
IMPRESSION: No evidence of urolithiasis, hydronephrosis, or other acute
findings.

Moderate hiatal hernia.

Mildly enlarged prostate.

Aortic atherosclerosis.

## 2018-09-16 ENCOUNTER — Telehealth: Payer: Self-pay | Admitting: *Deleted

## 2018-09-16 DIAGNOSIS — Z20822 Contact with and (suspected) exposure to covid-19: Secondary | ICD-10-CM

## 2018-09-16 NOTE — Telephone Encounter (Signed)
Pt scheduled for tomorrow at [facility] site. Requested by [place].  Pt with symptoms, possible exposure.  Testing process reviewed, wear mask, stay in car; pt verbalizes understanding.  CB# [phone]

## 2018-09-17 ENCOUNTER — Other Ambulatory Visit: Payer: BLUE CROSS/BLUE SHIELD

## 2018-09-17 DIAGNOSIS — Z20822 Contact with and (suspected) exposure to covid-19: Secondary | ICD-10-CM

## 2018-09-22 LAB — NOVEL CORONAVIRUS, NAA: SARS-CoV-2, NAA: DETECTED — AB

## 2023-04-05 ENCOUNTER — Ambulatory Visit: Payer: BC Managed Care – PPO

## 2023-04-05 DIAGNOSIS — K640 First degree hemorrhoids: Secondary | ICD-10-CM | POA: Diagnosis not present

## 2023-04-05 DIAGNOSIS — K5730 Diverticulosis of large intestine without perforation or abscess without bleeding: Secondary | ICD-10-CM | POA: Diagnosis not present

## 2023-04-05 DIAGNOSIS — Z1211 Encounter for screening for malignant neoplasm of colon: Secondary | ICD-10-CM | POA: Diagnosis present

## 2023-04-05 DIAGNOSIS — K635 Polyp of colon: Secondary | ICD-10-CM | POA: Diagnosis not present

## 2023-11-07 ENCOUNTER — Other Ambulatory Visit: Payer: Self-pay | Admitting: Family Medicine

## 2023-11-07 DIAGNOSIS — N6324 Unspecified lump in the left breast, lower inner quadrant: Secondary | ICD-10-CM

## 2023-11-13 ENCOUNTER — Ambulatory Visit
Admission: RE | Admit: 2023-11-13 | Discharge: 2023-11-13 | Disposition: A | Discharge status: Home / Self Care | Payer: Self-pay | Source: Ambulatory Visit | Attending: Family Medicine | Admitting: Family Medicine

## 2023-11-13 DIAGNOSIS — N6324 Unspecified lump in the left breast, lower inner quadrant: Secondary | ICD-10-CM
